# Patient Record
Sex: Male | Born: 1985 | Race: White | Hispanic: No | Marital: Single | Smoking: Current every day smoker
Health system: Southern US, Community
[De-identification: ages and names within clinical notes are randomized; demographics above are authoritative.]

## PROBLEM LIST (undated history)

## (undated) DIAGNOSIS — R41840 Attention and concentration deficit: Secondary | ICD-10-CM

---
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
## 2007-06-18 IMAGING — CT CT HEAD W/O CM
1 of 3 series · 13 of 30 positions shown, 17 images · IV contrast (agent unspecified)
Comparison: None.

CLINICAL DATA: Altered mental status.  19-year-old male. 
 HEAD CT WITHOUT CONTRAST:
TECHNIQUE: Contiguous axial images were obtained from the base of the skull through the vertex according to standard protocol without contrast.

[Series 4: recon 3: brain · axial · 0.47mm/px · z∈[+208,+352]mm · 13 of 64 slices shown, 17 images]
[im 5/64  brain]
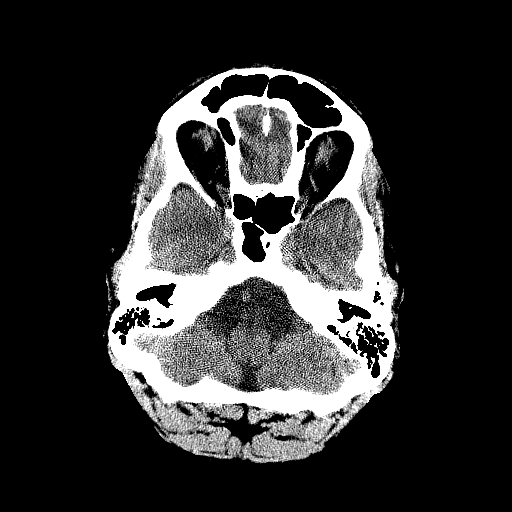
[im 5/64  bone]
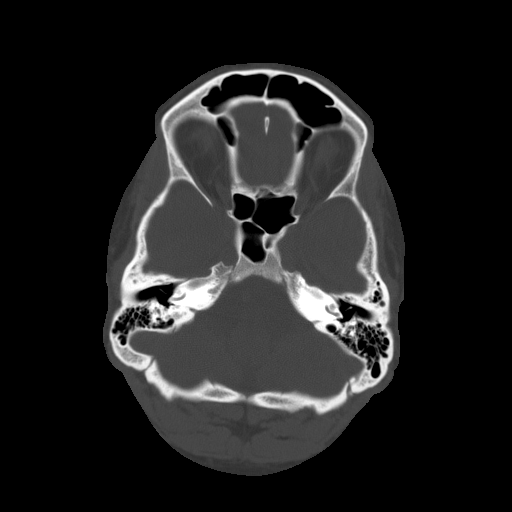
[im 10/64  brain]
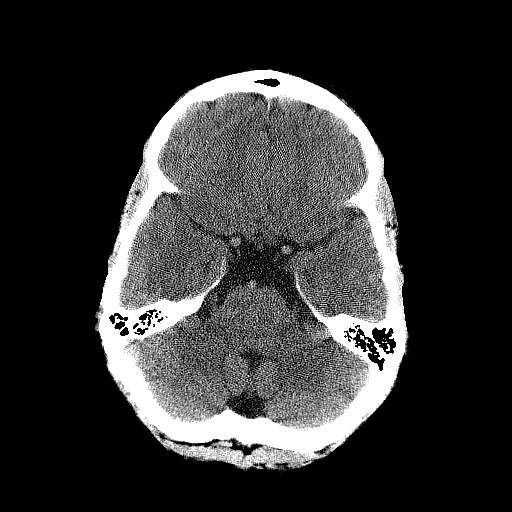
[im 14/64  brain]
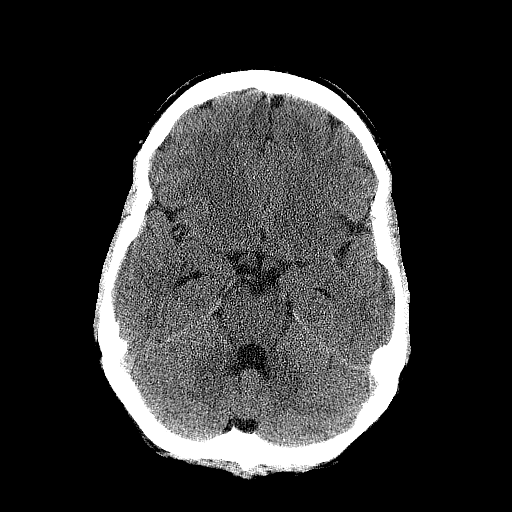
[im 19/64  brain]
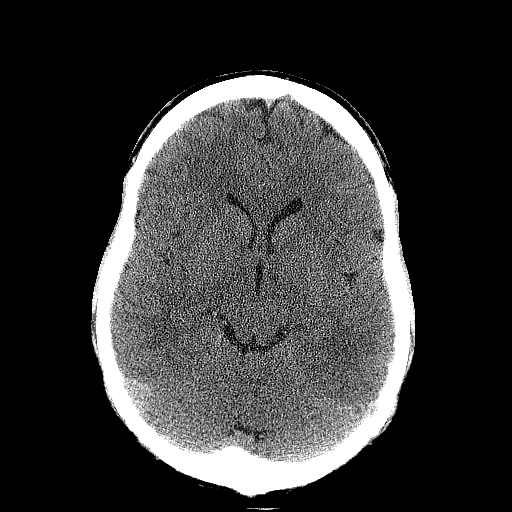
[im 23/64  brain]
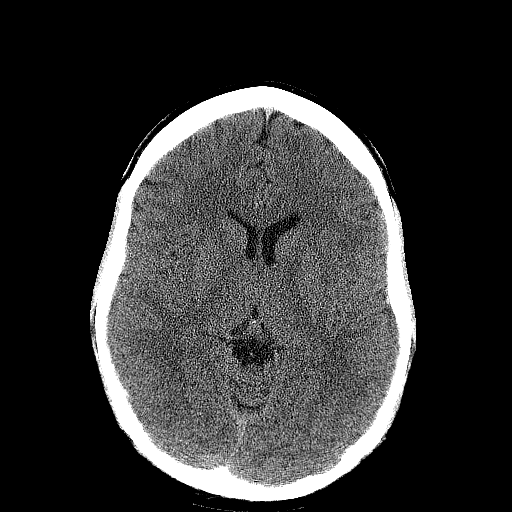
[im 23/64  bone]
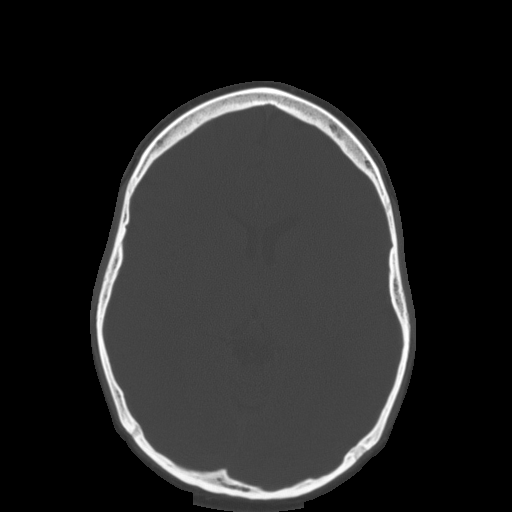
[im 28/64  brain]
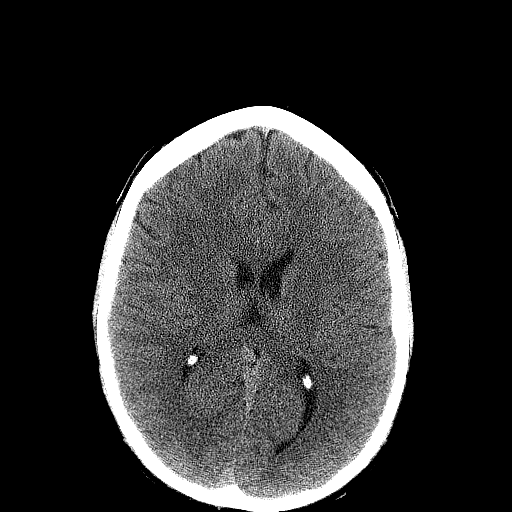
[im 32/64  brain]
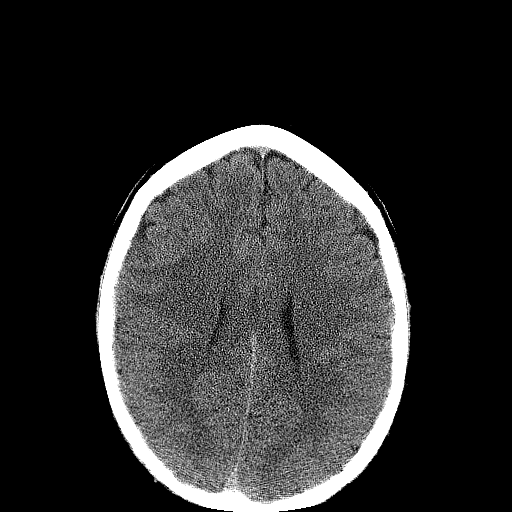
[im 37/64  brain]
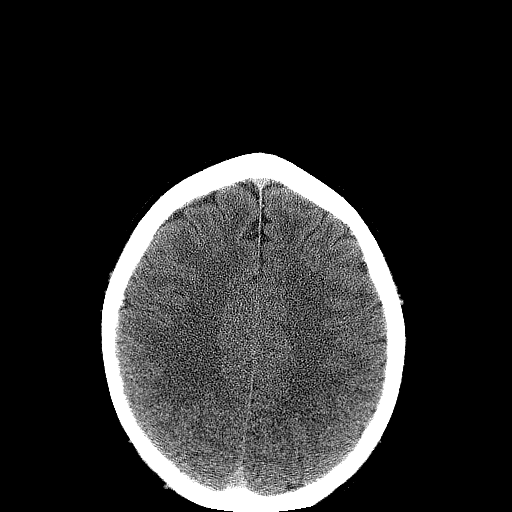
[im 41/64  brain]
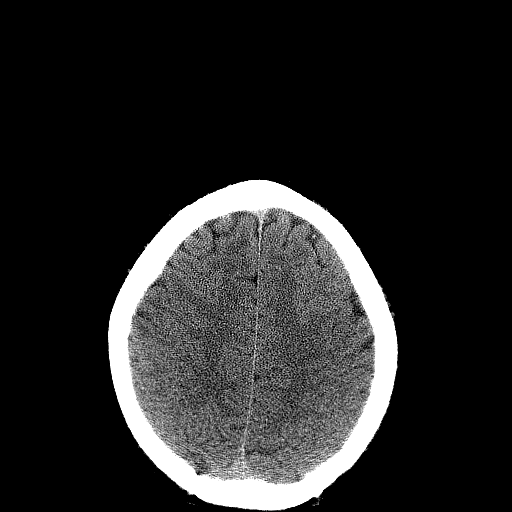
[im 41/64  bone]
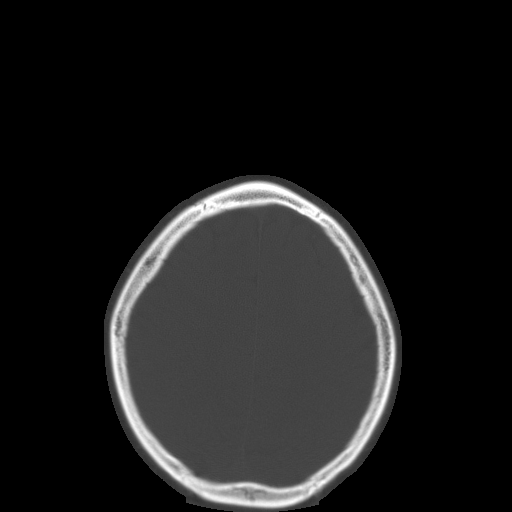
[im 46/64  brain]
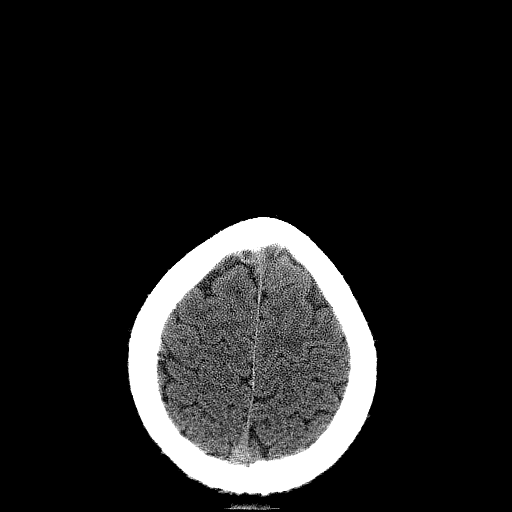
[im 50/64  brain]
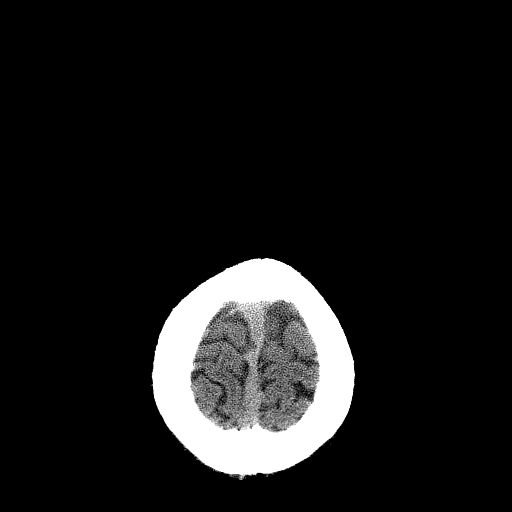
[im 55/64  brain]
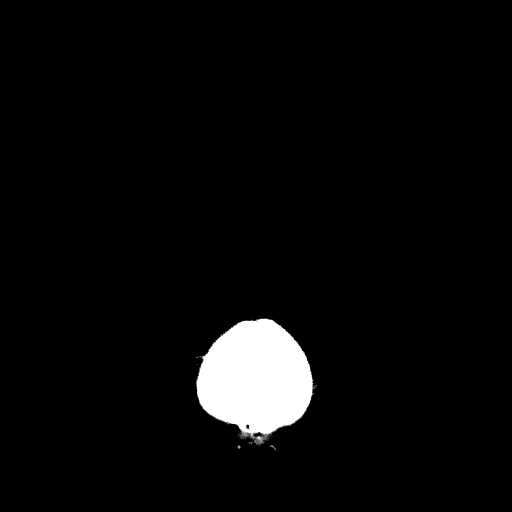
[im 59/64  brain]
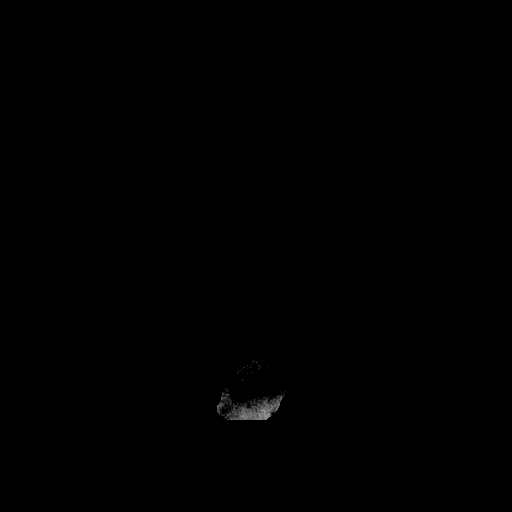
[im 59/64  bone]
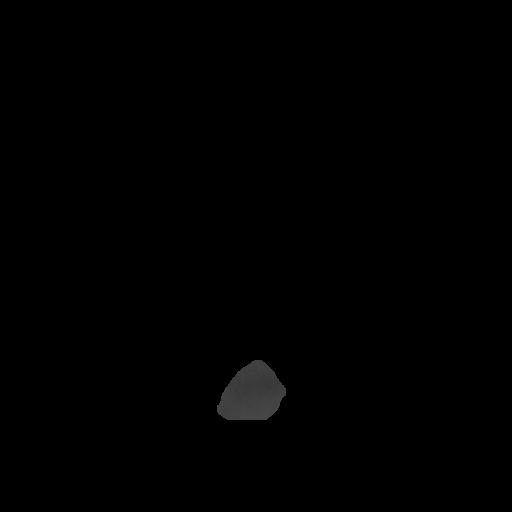

[13 of 30 positions shown; findings below may reference images not displayed]

FINDINGS: No evidence of acute intracranial abnormality including mass or mass effect, hydrocephalus, extraaxial fluid collection, midline shift, hemorrhage, or infarct.  Acute infarct may be occult on CT.  The visualized bony calvarium is unremarkable.
IMPRESSION: No evidence of intracranial abnormality. 

 ,

## 2010-11-16 ENCOUNTER — Emergency Department (INDEPENDENT_AMBULATORY_CARE_PROVIDER_SITE_OTHER): Payer: Worker's Compensation

## 2010-11-16 ENCOUNTER — Emergency Department (HOSPITAL_BASED_OUTPATIENT_CLINIC_OR_DEPARTMENT_OTHER)
Admission: EM | Admit: 2010-11-16 | Discharge: 2010-11-16 | Disposition: A | Discharge status: Home / Self Care | Payer: Worker's Compensation | Attending: Emergency Medicine | Admitting: Emergency Medicine

## 2010-11-16 ENCOUNTER — Encounter: Payer: Self-pay | Admitting: *Deleted

## 2010-11-16 DIAGNOSIS — R109 Unspecified abdominal pain: Secondary | ICD-10-CM

## 2010-11-16 DIAGNOSIS — IMO0002 Reserved for concepts with insufficient information to code with codable children: Secondary | ICD-10-CM | POA: Insufficient documentation

## 2010-11-16 DIAGNOSIS — S0990XA Unspecified injury of head, initial encounter: Secondary | ICD-10-CM

## 2010-11-16 DIAGNOSIS — M25519 Pain in unspecified shoulder: Secondary | ICD-10-CM | POA: Insufficient documentation

## 2010-11-16 DIAGNOSIS — Y92410 Unspecified street and highway as the place of occurrence of the external cause: Secondary | ICD-10-CM | POA: Insufficient documentation

## 2010-11-16 DIAGNOSIS — R51 Headache: Secondary | ICD-10-CM

## 2010-11-16 DIAGNOSIS — Z043 Encounter for examination and observation following other accident: Secondary | ICD-10-CM

## 2010-11-16 DIAGNOSIS — S301XXA Contusion of abdominal wall, initial encounter: Secondary | ICD-10-CM

## 2010-11-16 HISTORY — DX: Attention and concentration deficit: R41.840

## 2010-11-16 MED ORDER — HYDROCODONE-ACETAMINOPHEN 5-500 MG PO TABS: 1.0000 | ORAL_TABLET | Freq: Four times a day (QID) | ORAL | Status: AC | PRN

## 2010-11-16 MED ORDER — IOHEXOL 300 MG/ML  SOLN
100.0000 mL | Freq: Once | INTRAMUSCULAR | Status: AC | PRN
  Administered 2010-11-16: 100 mL via INTRAVENOUS

## 2010-11-16 NOTE — ED Provider Notes (Signed)
Medical screening examination/treatment/procedure(s) were performed by non-physician practitioner and as supervising physician I was immediately available for consultation/collaboration.   Charles B. Bernette Mayers, MD 11/16/10 1441
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
## 2013-05-15 ENCOUNTER — Emergency Department (HOSPITAL_COMMUNITY)
Admission: EM | Admit: 2013-05-15 | Discharge: 2013-05-15 | Disposition: A | Discharge status: Home / Self Care | Payer: Self-pay | Attending: Emergency Medicine | Admitting: Emergency Medicine

## 2013-05-15 ENCOUNTER — Encounter (HOSPITAL_COMMUNITY): Payer: Self-pay | Admitting: Emergency Medicine

## 2013-05-15 DIAGNOSIS — F17200 Nicotine dependence, unspecified, uncomplicated: Secondary | ICD-10-CM | POA: Insufficient documentation

## 2013-05-15 DIAGNOSIS — L0501 Pilonidal cyst with abscess: Secondary | ICD-10-CM | POA: Insufficient documentation

## 2013-05-15 DIAGNOSIS — Z8659 Personal history of other mental and behavioral disorders: Secondary | ICD-10-CM | POA: Insufficient documentation

## 2013-05-15 DIAGNOSIS — R269 Unspecified abnormalities of gait and mobility: Secondary | ICD-10-CM | POA: Insufficient documentation

## 2013-05-15 MED ORDER — ONDANSETRON HCL 4 MG PO TABS
4.0000 mg | ORAL_TABLET | Freq: Once | ORAL | Status: AC
  Administered 2013-05-15: 4 mg via ORAL
  Filled 2013-05-15: qty 1

## 2013-05-15 MED ORDER — OXYCODONE-ACETAMINOPHEN 5-325 MG PO TABS
2.0000 | ORAL_TABLET | Freq: Once | ORAL | Status: AC
  Administered 2013-05-15: 2 via ORAL
  Filled 2013-05-15: qty 2

## 2013-05-15 MED ORDER — ONDANSETRON HCL 4 MG PO TABS: 4.0000 mg | ORAL_TABLET | Freq: Three times a day (TID) | ORAL | Status: AC | PRN

## 2013-05-15 MED ORDER — OXYCODONE-ACETAMINOPHEN 5-325 MG PO TABS: 1.0000 | ORAL_TABLET | ORAL | Status: AC | PRN

## 2013-05-15 NOTE — ED Notes (Addendum)
Pt a+ox4, presents with c/o "swollen tailbone" x1 week, worsening.  Pt denies injury.  Approx 1.5" diameter pink, raised area to coccyx noted. No drng.  Pt reports 8/10 pain to area.  Pt denies fevers/chills, denies hx of similar. Otherwise skin PWD.  Speaking full/clear sentences.  MAEI.   Pt reports taking advil with relief of pain.

## 2013-05-15 NOTE — ED Provider Notes (Addendum)
CSN: [id number]     Arrival date & time 05/15/13  1050 History   First MD Initiated Contact with Patient 05/15/13 1122     Chief Complaint  Patient presents with  . Abscess  . Pain   (Consider location/radiation/quality/duration/timing/severity/associated sxs/prior Treatment) Patient is a 28 y.o. male presenting with abscess. The history is provided by the patient. No language interpreter was used.  Abscess Location:  Ano-genital Ano-genital abscess location:  Gluteal cleft Abscess quality: fluctuance, painful and redness   Red streaking: no   Duration:  7 days Progression:  Worsening Pain details:    Quality:  Burning and aching   Severity:  Severe   Progression:  Worsening Context: not diabetes   Relieved by:  Nothing Exacerbated by: sitting. Ineffective treatments:  None tried Associated symptoms: no anorexia, no fatigue, no fever, no headaches and no nausea   Risk factors: no hx of MRSA and no prior abscess     Past Medical History  Diagnosis Date  . Attention deficit    History reviewed. No pertinent past surgical history. No family history on file. History  Substance Use Topics  . Smoking status: Current Every Day Smoker  . Smokeless tobacco: Not on file  . Alcohol Use: No    Review of Systems  Constitutional: Negative for fever and fatigue.  Gastrointestinal: Negative for nausea and anorexia.  Musculoskeletal: Positive for gait problem.  Neurological: Negative for headaches.    Allergies  Review of patient's allergies indicates no known allergies.  Home Medications   Current Outpatient Rx  Name  Route  Sig  Dispense  Refill  . ibuprofen (ADVIL,MOTRIN) 200 MG tablet   Oral   Take 600 mg by mouth every 6 (six) hours as needed for moderate pain.          BP 131/95  Pulse 89  Temp(Src) 97.9 F (36.6 C) (Oral)  Resp 16  Ht 5\' 10"  (1.778 m)  Wt 240 lb (108.863 kg)  BMI 34.44 kg/m2  SpO2 99% Physical Exam  Nursing note and vitals  reviewed. Constitutional: He appears well-developed and well-nourished. No distress.  HENT:  Head: Normocephalic and atraumatic.  Eyes: Conjunctivae are normal. No scleral icterus.  Neck: Normal range of motion. Neck supple.  Cardiovascular: Normal rate, regular rhythm and normal heart sounds.   Pulmonary/Chest: Effort normal and breath sounds normal. No respiratory distress.  Abdominal: Soft. There is no tenderness.  Musculoskeletal: He exhibits no edema.       Back:  Neurological: He is alert.  Skin: Skin is warm and dry. He is not diaphoretic.  Psychiatric: His behavior is normal.    ED Course  Procedures (including critical care time) Labs Review Labs Reviewed - No data to display Imaging Review No results found.  EKG Interpretation   None      INCISION AND DRAINAGE Performed by: Arthor Captain Consent: Verbal consent obtained. Risks and benefits: risks, benefits and alternatives were discussed Type: abscess  Body area: gluteal cleft  Anesthesia: local infiltration  Incision was made with a scalpel.  Local anesthetic: lidocaine 2% w epinephrine  Anesthetic total: 5 ml  Complexity: complex Blunt dissection to break up loculations  Drainage: purulent  Drainage amount: copious  Packing material: 1/4 in iodoform gauze  Patient tolerance: Patient tolerated the procedure well with no immediate complications.    MDM   1. Pilonidal cyst with abscess    Patient with pilonidal abscess. I&D performed. Flushed and Packed. No signs fo cellulitis. Patient is advised  to return to the ED for packing removal and wound check in 48-72 hours. Supportive care and wound care given.    Arthor Captainbigail Kaliope Quinonez, PA-C 05/15/13 2122  Arthor CaptainAbigail Aylan Bayona, PA-C 05/24/13 2034

## 2013-05-15 NOTE — Discharge Instructions (Signed)
Followup with your doctor or an urgent care or the ed in order to remove your packing in 48-72 hours. You may return to the emergency department if you have  a fever that persists greater than 101 or your abscess appears to become infected (growing surrounding redness and warmth). Do not operate any heavy machinery while on pain medications. Do not consume alcohol on these medications either.  Abscess An abscess (boil or furuncle) is an infected area that contains a collection of pus.  SYMPTOMS Signs and symptoms of an abscess include pain, tenderness, redness, or hardness. You may feel a moveable soft area under your skin. An abscess can occur anywhere in the body.  TREATMENT  A surgical cut (incision) may be made over your abscess to drain the pus. Gauze may be packed into the space or a drain may be looped through the abscess cavity (pocket). This provides a drain that will allow the cavity to heal from the inside outwards. The abscess may be painful for a few days, but should feel much better if it was drained.  Your abscess, if seen early, may not have localized and may not have been drained. If not, another appointment may be required if it does not get better on its own or with medications. HOME CARE INSTRUCTIONS   Only take over-the-counter or prescription medicines for pain, discomfort, or fever as directed by your caregiver.   Take your antibiotics as directed if they were prescribed. Finish them even if you start to feel better.   Keep the skin and clothes clean around your abscess.   If the abscess was drained, you will need to use gauze dressing to collect any draining pus. Dressings will typically need to be changed 3 or more times a day.   The infection may spread by skin contact with others. Avoid skin contact as much as possible.   Practice good hygiene. This includes regular hand washing, cover any draining skin lesions, and do not share personal care items.   If you  participate in sports, do not share athletic equipment, towels, whirlpools, or personal care items. Shower after every practice or tournament.   If a draining area cannot be adequately covered:   Do not participate in sports.   Children should not participate in day care until the wound has healed or drainage stops.   If your caregiver has given you a follow-up appointment, it is very important to keep that appointment. Not keeping the appointment could result in a much worse infection, chronic or permanent injury, pain, and disability. If there is any problem keeping the appointment, you must call back to this facility for assistance.  SEEK MEDICAL CARE IF:   You develop increased pain, swelling, redness, drainage, or bleeding in the wound site.   You develop signs of generalized infection including muscle aches, chills, fever, or a general ill feeling.   You have an oral temperature above 102 F (38.9 C).  MAKE SURE YOU:   Understand these instructions.   Will watch your condition.   Will get help right away if you are not doing well or get worse.  Document Released: 01/08/2005 Document Revised: 12/11/2010 Document Reviewed: 11/02/2007 [facility] Patient Information 2012 [place].    Pilonidal Cyst A pilonidal cyst occurs when hairs get trapped (ingrown) beneath the skin in the crease between the buttocks over your sacrum (the bone under that crease). Pilonidal cysts are most common in young men with a lot of body hair. When  the cyst is ruptured (breaks) or leaking, fluid from the cyst may cause burning and itching. If the cyst becomes infected, it causes a painful swelling filled with pus (abscess). The pus and trapped hairs need to be removed (often by lancing) so that the infection can heal. However, recurrence is common and an operation may be needed to remove the cyst. HOME CARE INSTRUCTIONS   If the cyst was NOT INFECTED:  Keep the area clean and dry. Bathe or shower  daily. Wash the area well with a germ-killing soap. Warm tub baths may help prevent infection and help with drainage. Dry the area well with a towel.  Avoid tight clothing to keep area as moisture free as possible.  Keep area between buttocks as free of hair as possible. A depilatory may be used.  If the cyst WAS INFECTED and needed to be drained:  Your caregiver packed the wound with gauze to keep the wound open. This allows the wound to heal from the inside outwards and continue draining.  Return for a wound check in 1 day or as suggested.  If you take tub baths or showers, repack the wound with gauze following them. Sponge baths (at the sink) are a good alternative.  If an antibiotic was ordered to fight the infection, take as directed.  Only take over-the-counter or prescription medicines for pain, discomfort, or fever as directed by your caregiver.  After the drain is removed, use sitz baths for 20 minutes 4 times per day. Clean the wound gently with mild unscented soap, pat dry, and then apply a dry dressing. SEEK MEDICAL CARE IF:   You have increased pain, swelling, redness, drainage, or bleeding from the area.  You have a fever.  You have muscles aches, dizziness, or a general ill feeling. Document Released: 03/28/2000 Document Revised: 06/23/2011 Document Reviewed: 05/26/2008 [facility] Patient Information 2014 [place], [place].  Abscess An abscess is an infected area that contains a collection of pus and debris.It can occur in almost any part of the body. An abscess is also known as a furuncle or boil. CAUSES  An abscess occurs when tissue gets infected. This can occur from blockage of oil or sweat glands, infection of hair follicles, or a minor injury to the skin. As the body tries to fight the infection, pus collects in the area and creates pressure under the skin. This pressure causes pain. People with weakened immune systems have difficulty fighting infections and get  certain abscesses more often.  SYMPTOMS Usually an abscess develops on the skin and becomes a painful mass that is red, warm, and tender. If the abscess forms under the skin, you may feel a moveable soft area under the skin. Some abscesses break open (rupture) on their own, but most will continue to get worse without care. The infection can spread deeper into the body and eventually into the bloodstream, causing you to feel ill.  DIAGNOSIS  Your caregiver will take your medical history and perform a physical exam. A sample of fluid may also be taken from the abscess to determine what is causing your infection. TREATMENT  Your caregiver may prescribe antibiotic medicines to fight the infection. However, taking antibiotics alone usually does not cure an abscess. Your caregiver may need to make a small cut (incision) in the abscess to drain the pus. In some cases, gauze is packed into the abscess to reduce pain and to continue draining the area. HOME CARE INSTRUCTIONS   Only take over-the-counter or prescription medicines  for pain, discomfort, or fever as directed by your caregiver.  If you were prescribed antibiotics, take them as directed. Finish them even if you start to feel better.  If gauze is used, follow your caregiver's directions for changing the gauze.  To avoid spreading the infection:  Keep your draining abscess covered with a bandage.  Wash your hands well.  Do not share personal care items, towels, or whirlpools with others.  Avoid skin contact with others.  Keep your skin and clothes clean around the abscess.  Keep all follow-up appointments as directed by your caregiver. SEEK MEDICAL CARE IF:   You have increased pain, swelling, redness, fluid drainage, or bleeding.  You have muscle aches, chills, or a general ill feeling.  You have a fever. MAKE SURE YOU:   Understand these instructions.  Will watch your condition.  Will get help right away if you are not doing  well or get worse. Document Released: 01/08/2005 Document Revised: 09/30/2011 Document Reviewed: 06/13/2011 [facility] Patient Information 2014 [place], [place].

## 2013-05-16 NOTE — ED Provider Notes (Signed)
Medical screening examination/treatment/procedure(s) were performed by non-physician practitioner and as supervising physician I was immediately available for consultation/collaboration.  EKG Interpretation   None         Erionna Strum H Rmani Kapusta, MD 05/16/13 1554 

## 2013-05-26 NOTE — ED Provider Notes (Signed)
Medical screening examination/treatment/procedure(s) were performed by non-physician practitioner and as supervising physician I was immediately available for consultation/collaboration.  EKG Interpretation   None         Richardean Canalavid H Yao, MD 05/26/13 2205

## 2014-08-11 ENCOUNTER — Encounter (HOSPITAL_COMMUNITY): Payer: Self-pay

## 2014-08-11 ENCOUNTER — Emergency Department (HOSPITAL_COMMUNITY)
Admission: EM | Admit: 2014-08-11 | Discharge: 2014-08-11 | Disposition: A | Discharge status: Home / Self Care | Payer: Self-pay | Attending: Emergency Medicine | Admitting: Emergency Medicine

## 2014-08-11 DIAGNOSIS — R238 Other skin changes: Secondary | ICD-10-CM

## 2014-08-11 DIAGNOSIS — R000 Tachycardia, unspecified: Secondary | ICD-10-CM | POA: Insufficient documentation

## 2014-08-11 DIAGNOSIS — L709 Acne, unspecified: Secondary | ICD-10-CM | POA: Insufficient documentation

## 2014-08-11 DIAGNOSIS — Z720 Tobacco use: Secondary | ICD-10-CM | POA: Insufficient documentation

## 2014-08-11 MED ORDER — SULFAMETHOXAZOLE-TRIMETHOPRIM 800-160 MG PO TABS: 1.0000 | ORAL_TABLET | Freq: Two times a day (BID) | ORAL | Status: AC

## 2014-08-11 MED ORDER — SULFAMETHOXAZOLE-TRIMETHOPRIM 800-160 MG PO TABS
1.0000 | ORAL_TABLET | Freq: Once | ORAL | Status: AC
Start: 2014-08-11 — End: 2014-08-11
  Administered 2014-08-11: 1 via ORAL
  Filled 2014-08-11: qty 1

## 2014-08-11 MED ORDER — LORAZEPAM 1 MG PO TABS
1.0000 mg | ORAL_TABLET | Freq: Once | ORAL | Status: AC
  Administered 2014-08-11: 1 mg via ORAL
  Filled 2014-08-11: qty 1

## 2014-08-11 NOTE — ED Provider Notes (Signed)
CSN: [id number]     Arrival date & time 08/11/14  0710 History   First MD Initiated Contact with Patient 08/11/14 0719     Chief Complaint  Patient presents with  . Rash     (Consider location/radiation/quality/duration/timing/severity/associated sxs/prior Treatment) HPI Stephen Vargas is a 29 y.o. male who comes in for evaluation of rash on his left groin. Patient states he was concerned it might be testicular cancer because his brother's friend died of testicular cancer he wanted to get checked out. He reports he noticed a red bump on the left side of his scrotum that was tender to touch, without drainage. He reports trying to "pop it" and when he did not workDenies fevers, chills, difficulty urinating, penile discharge, scrotal swelling, groin pain.  Past Medical History  Diagnosis Date  . Attention deficit    History reviewed. No pertinent past surgical history. Family History  Problem Relation Age of Onset  . Multiple sclerosis Mother   . Diverticulitis Father    History  Substance Use Topics  . Smoking status: Current Every Day Smoker -- 0.50 packs/day    Types: Cigarettes  . Smokeless tobacco: Never Used  . Alcohol Use: No    Review of Systems  Constitutional: Negative for fever.  Respiratory: Negative for shortness of breath.   Cardiovascular: Negative for chest pain.  Skin: Positive for rash.      Allergies  Review of patient's allergies indicates no known allergies.  Home Medications   Prior to Admission medications   Medication Sig Start Date End Date Taking? Authorizing Provider  ondansetron (ZOFRAN) 4 MG tablet Take 1 tablet (4 mg total) by mouth every 8 (eight) hours as needed for nausea or vomiting. Patient not taking: Reported on 08/11/2014 05/15/13   Arthor CaptainAbigail Harris, PA-C  oxyCODONE-acetaminophen (PERCOCET) 5-325 MG per tablet Take 1-2 tablets by mouth every 4 (four) hours as needed. Patient not taking: Reported on 08/11/2014 05/15/13   Arthor CaptainAbigail Harris,  PA-C   BP 140/79 mmHg  Pulse 109  Temp(Src) 98.4 F (36.9 C) (Oral)  Resp 19  SpO2 99% Physical Exam  Constitutional:  Awake, alert, nontoxic appearance. Patient is very anxious  HENT:  Head: Atraumatic.  Eyes: Right eye exhibits no discharge. Left eye exhibits no discharge.  Neck: Neck supple.  Cardiovascular: Regular rhythm and normal heart sounds.  Exam reveals no gallop and no friction rub.   No murmur heard. tachycardic  Pulmonary/Chest: Effort normal. He exhibits no tenderness.  Abdominal: Soft. There is no tenderness. There is no rebound.  Genitourinary:  Small area of erythema, consistent with furuncle or pimple on left side of scrotum. Shaved pubic area with folliculitis  Musculoskeletal: He exhibits no tenderness.  Baseline ROM, no obvious new focal weakness.  Neurological:  Mental status and motor strength appears baseline for patient and situation.  Skin: No rash noted.  Psychiatric: He has a normal mood and affect.  Nursing note and vitals reviewed.   ED Course  Procedures (including critical care time) Labs Review Labs Reviewed - No data to display  Imaging Review No results found.   EKG Interpretation None     Meds given in ED:  Medications  sulfamethoxazole-trimethoprim (BACTRIM DS,SEPTRA DS) 800-160 MG per tablet 1 tablet (1 tablet Oral Given 08/11/14 0755)  LORazepam (ATIVAN) tablet 1 mg (1 mg Oral Given 08/11/14 0800)    New Prescriptions   No medications on file   Filed Vitals:   08/11/14 0717 08/11/14 0923  BP: 157/107 140/79  Pulse:  132 109  Temp: 98.1 F (36.7 C) 98.4 F (36.9 C)  TempSrc: Oral Oral  Resp: 19   SpO2: 100% 99%     Meds given in ED:  Medications  sulfamethoxazole-trimethoprim (BACTRIM DS,SEPTRA DS) 800-160 MG per tablet 1 tablet (1 tablet Oral Given 08/11/14 0755)  LORazepam (ATIVAN) tablet 1 mg (1 mg Oral Given 08/11/14 0800)    New Prescriptions   No medications on file   Filed Vitals:   08/11/14 0717  08/11/14 0923  BP: 157/107 140/79  Pulse: 132 109  Temp: 98.1 F (36.7 C) 98.4 F (36.9 C)  TempSrc: Oral Oral  Resp: 19   SpO2: 100% 99%    MDM  Patient is tachycardic, however vital signs are improving in ED. Suspect due to anxiety. Physical exam consistent with pubic folliculitis DC with Septra. Encouraged to continue symptomatic care at home with hot compresses. Discussed follow-up with primary care for further evaluation and management of symptoms. Not concerning for other acute or emergent pathology at this time. Final diagnoses:  Pimples        Joycie Peek, PA-C 08/11/14 0945  Donnetta Hutching, MD 08/11/14 1540

## 2014-08-11 NOTE — Discharge Instructions (Signed)
Folliculitis  Folliculitis is redness, soreness, and swelling (inflammation) of the hair follicles. This condition can occur anywhere on the body. People with weakened immune systems, diabetes, or obesity have a greater risk of getting folliculitis. CAUSES  Bacterial infection. This is the most common cause.  Fungal infection.  Viral infection.  Contact with certain chemicals, especially oils and tars. Long-term folliculitis can result from bacteria that live in the nostrils. The bacteria may trigger multiple outbreaks of folliculitis over time. SYMPTOMS Folliculitis most commonly occurs on the scalp, thighs, legs, back, buttocks, and areas where hair is shaved frequently. An early sign of folliculitis is a small, white or yellow, pus-filled, itchy lesion (pustule). These lesions appear on a red, inflamed follicle. They are usually less than 0.2 inches (5 mm) wide. When there is an infection of the follicle that goes deeper, it becomes a boil or furuncle. A group of closely packed boils creates a larger lesion (carbuncle). Carbuncles tend to occur in hairy, sweaty areas of the body. DIAGNOSIS  Your caregiver can usually tell what is wrong by doing a physical exam. A sample may be taken from one of the lesions and tested in a lab. This can help determine what is causing your folliculitis. TREATMENT  Treatment may include:  Applying warm compresses to the affected areas.  Taking antibiotic medicines orally or applying them to the skin.  Draining the lesions if they contain a large amount of pus or fluid.  Laser hair removal for cases of long-lasting folliculitis. This helps to prevent regrowth of the hair. HOME CARE INSTRUCTIONS  Apply warm compresses to the affected areas as directed by your caregiver.  If antibiotics are prescribed, take them as directed. Finish them even if you start to feel better.  You may take over-the-counter medicines to relieve itching.  Do not shave  irritated skin.  Follow up with your caregiver as directed. SEEK IMMEDIATE MEDICAL CARE IF:   You have increasing redness, swelling, or pain in the affected area.  You have a fever. MAKE SURE YOU:  Understand these instructions.  Will watch your condition.  Will get help right away if you are not doing well or get worse. Document Released: 06/09/2001 Document Revised: 09/30/2011 Document Reviewed: 07/01/2011 [facility] Patient Information 2015 [place], [place]. This information is not intended to replace advice given to you by your health care provider. Make sure you discuss any questions you have with your health care provider.  Please follow-up with your primary care for further evaluation and management of your symptoms. You may also use hot compresses to help with your symptoms. Return to ED for worsening symptoms.

## 2014-08-11 NOTE — ED Notes (Signed)
Patient states that he has a red rash on the pubic area x 1 year. He sates that he now has a slight red rash on the left groin area. Patient stated that he tried to "pop it." Patient states he woke yesterday with pressure in his lower abdomen. Patient is very anxious.

## 2019-10-01 ENCOUNTER — Emergency Department (HOSPITAL_BASED_OUTPATIENT_CLINIC_OR_DEPARTMENT_OTHER)
Admission: EM | Admit: 2019-10-01 | Discharge: 2019-10-01 | Disposition: A | Discharge status: Home / Self Care | Payer: BC Managed Care – PPO | Attending: Emergency Medicine | Admitting: Emergency Medicine

## 2019-10-01 ENCOUNTER — Other Ambulatory Visit: Payer: Self-pay

## 2019-10-01 ENCOUNTER — Encounter (HOSPITAL_BASED_OUTPATIENT_CLINIC_OR_DEPARTMENT_OTHER): Payer: Self-pay | Admitting: Emergency Medicine

## 2019-10-01 DIAGNOSIS — S0191XA Laceration without foreign body of unspecified part of head, initial encounter: Secondary | ICD-10-CM | POA: Insufficient documentation

## 2019-10-01 DIAGNOSIS — W311XXA Contact with metalworking machines, initial encounter: Secondary | ICD-10-CM | POA: Insufficient documentation

## 2019-10-01 DIAGNOSIS — S0990XA Unspecified injury of head, initial encounter: Secondary | ICD-10-CM | POA: Diagnosis present

## 2019-10-01 DIAGNOSIS — Y998 Other external cause status: Secondary | ICD-10-CM | POA: Insufficient documentation

## 2019-10-01 DIAGNOSIS — Y9289 Other specified places as the place of occurrence of the external cause: Secondary | ICD-10-CM | POA: Diagnosis not present

## 2019-10-01 DIAGNOSIS — Y9389 Activity, other specified: Secondary | ICD-10-CM | POA: Diagnosis not present

## 2019-10-01 MED ORDER — LIDOCAINE-EPINEPHRINE 2 %-1:100000 IJ SOLN
20.0000 mL | Freq: Once | INTRAMUSCULAR | Status: DC
Start: 2019-10-01 — End: 2019-10-01

## 2019-10-01 MED ORDER — LIDOCAINE-EPINEPHRINE (PF) 1 %-1:200000 IJ SOLN
INTRAMUSCULAR | Status: AC
  Administered 2019-10-01: 30 mL
  Filled 2019-10-01: qty 30

## 2019-10-01 MED ORDER — LIDOCAINE-EPINEPHRINE (PF) 2 %-1:200000 IJ SOLN: 20.0000 mL | Freq: Once | INTRAMUSCULAR | Status: DC

## 2019-10-01 MED ORDER — TETANUS-DIPHTH-ACELL PERTUSSIS 5-2.5-18.5 LF-MCG/0.5 IM SUSP
0.5000 mL | Freq: Once | INTRAMUSCULAR | Status: AC
  Administered 2019-10-01: 0.5 mL via INTRAMUSCULAR
  Filled 2019-10-01: qty 0.5

## 2019-10-01 NOTE — Discharge Instructions (Signed)
Please follow up with your primary care provider within 5-7 days for re-evaluation of your symptoms. If you do not have a primary care provider, information for a healthcare clinic has been provided for you to make arrangements for follow up care.  Please return to the emergency room immediately if you experience any new or worsening symptoms or any symptoms that indicate worsening infection such as fevers, increased redness/swelling/pain, warmth, or drainage from the affected area.    

## 2019-10-01 NOTE — ED Provider Notes (Signed)
[facility] EMERGENCY DEPARTMENT Provider Note   CSN: [id number] Arrival date & time: 10/01/19  1309     History Chief Complaint  Patient presents with  . Head Injury    Lonza Shimabukuro is a 34 y.o. male.  HPI   Patient is a 34 year old male who presents to the emergency department today for evaluation of a head injury.  Patient states that what he told the triage nurse actually was not true.  He was at work prior to arrival and was working with a machine when a bolt flew out and hit him on the left side of the head.  He denies LOC.  States that he really did not even feel himself get hit he just noticed blood to the left side of his forehead.  He denies any headache, dizziness, lightheadedness, visual changes, unilateral numbness or weakness.  He has minimal pain and states he only came to the emergency department due to continued bleeding.  He is not sure when his last Tdap was.  Past Medical History:  Diagnosis Date  . Attention deficit     There are no problems to display for this patient.   History reviewed. No pertinent surgical history.     Family History  Problem Relation Age of Onset  . Multiple sclerosis Mother   . Diverticulitis Father     Social History   Tobacco Use  . Smoking status: Current Every Day Smoker    Packs/day: 0.50    Types: Cigarettes  . Smokeless tobacco: Never Used  Substance Use Topics  . Alcohol use: No  . 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 Authorizing Provider  ondansetron (ZOFRAN) 4 MG tablet Take 1 tablet (4 mg total) by mouth every 8 (eight) hours as needed for nausea or vomiting. Patient not taking: Reported on 08/11/2014 05/15/13   Margarita Mail, PA-C  oxyCODONE-acetaminophen (PERCOCET) 5-325 MG per tablet Take 1-2 tablets by mouth every 4 (four) hours as needed. Patient not taking: Reported on  08/11/2014 05/15/13   Margarita Mail, PA-C    Allergies    Patient has no known allergies.  Review of Systems   Review of Systems  Eyes: Negative for visual disturbance.  Gastrointestinal: Negative for nausea and vomiting.  Skin: Positive for wound.  Neurological: Negative for dizziness, weakness, light-headedness, numbness and headaches.       Minor head injury    Physical Exam Updated Vital Signs BP 134/88 (BP Location: Right Arm)   Pulse 70   Temp 98.5 F (36.9 C) (Oral)   Resp 18   Ht 5\' 10"  (1.778 m)   Wt 104.3 kg   SpO2 98%   BMI 33.00 kg/m   Physical Exam Constitutional:      General: He is not in acute distress.    Appearance: He is well-developed.  HENT:     Head:     Comments: 2 cm laceration to the left lateral forehead Eyes:     Conjunctiva/sclera: Conjunctivae normal.  Cardiovascular:     Rate and Rhythm: Normal rate.  Pulmonary:     Effort: Pulmonary effort is normal.  Skin:    General: Skin is warm and dry.  Neurological:     Mental Status: He is alert and oriented to person, place, and time.     Comments: Mental Status:  Alert, thought content appropriate,  able to give a coherent history. Speech fluent without evidence of aphasia. Able to follow 2 step commands without difficulty.  Cranial Nerves:  II: pupils equal, round, reactive to light III,IV, VI: ptosis not present, extra-ocular motions intact bilaterally  V,VII: smile symmetric, facial light touch sensation equal VIII: hearing grossly normal to voice  X: uvula elevates symmetrically  XI: bilateral shoulder shrug symmetric and strong XII: midline tongue extension without fassiculations Motor:  Normal tone. 5/5 strength of BUE and BLE major muscle groups including strong and equal grip strength and dorsiflexion/plantar flexion Sensory: light touch normal in all extremities. Cerebellar: normal finger-to-nose with bilateral upper extremities Gait: normal gait and balance.      ED Results  / Procedures / Treatments   Labs (all labs ordered are listed, but only abnormal results are displayed) Labs Reviewed - No data to display  EKG None  Radiology No results found.  Procedures .Marland KitchenLaceration Repair  Date/Time: 10/01/2019 3:44 PM Performed by: Karrie Meres, PA-C Authorized by: Karrie Meres, PA-C   Consent:    Consent obtained:  Verbal   Consent given by:  Patient   Risks discussed:  Infection, pain and poor cosmetic result   Alternatives discussed:  No treatment Anesthesia (see MAR for exact dosages):    Anesthesia method:  Local infiltration   Local anesthetic:  Lidocaine 1% w/o epi Laceration details:    Location: left forehead.   Length (cm):  2 Repair type:    Repair type:  Simple Pre-procedure details:    Preparation:  Patient was prepped and draped in usual sterile fashion Exploration:    Hemostasis achieved with:  Direct pressure and epinephrine   Wound exploration: wound explored through full range of motion and entire depth of wound probed and visualized     Contaminated: no   Treatment:    Area cleansed with:  Saline   Amount of cleaning:  Standard   Irrigation solution:  Sterile saline   Irrigation method:  Pressure wash   Visualized foreign bodies/material removed: no   Skin repair:    Repair method:  Sutures   Suture size:  5-0   Suture material:  Fast-absorbing gut   Suture technique:  Simple interrupted   Number of sutures:  3 Approximation:    Approximation:  Close Post-procedure details:    Dressing:  Open (no dressing)   Patient tolerance of procedure:  Tolerated well, no immediate complications   (including critical care time)  Medications Ordered in ED Medications  lidocaine-EPINEPHrine (XYLOCAINE W/EPI) 2 %-1:200000 (PF) injection 20 mL (20 mLs Intradermal Not Given 10/01/19 1517)  Tdap (BOOSTRIX) injection 0.5 mL (0.5 mLs Intramuscular Given 10/01/19 1516)  lidocaine-EPINEPHrine (XYLOCAINE-EPINEPHrine) 1 %-1:200000  (PF) injection (30 mLs  Given 10/01/19 1515)    ED Course  I have reviewed the triage vital signs and the nursing notes.  Pertinent labs & imaging results that were available during my care of the patient were reviewed by me and considered in my medical decision making (see chart for details).    MDM Rules/Calculators/A&P                          Laceration to the left side of the head that occurred after being hit in the head with a bolt. Normal neuro exam. Low concern for skull fx or intracranial injury therefore head CT deferred. Pt in agreement with this decision. Pressure irrigation performed. Wound explored and base of wound visualized in  a bloodless field without evidence of foreign body.  Laceration occurred < 8 hours prior to repair which was well tolerated.  Tdap updated.  Pt has  no comorbidities to effect normal wound healing. Pt discharged  without antibiotics.  Discussed suture home care with patient and answered questions. Absorbable sutures used; they are to return to the ED for signs of infection. Pt is hemodynamically stable with no complaints prior to dc.   Final Clinical Impression(s) / ED Diagnoses Final diagnoses:  Minor head injury, initial encounter  Laceration of head without foreign body, unspecified part of head, initial encounter    Rx / DC Orders ED Discharge Orders    None       Karrie Meres, PA-C 10/01/19 1546    Alvira Monday, MD 10/01/19 2235

## 2019-10-01 NOTE — ED Triage Notes (Signed)
States he was weed eating his yard when something hit him in the head. States he has a laceration. Bandage in place

## 2021-07-08 ENCOUNTER — Other Ambulatory Visit: Payer: Self-pay

## 2021-07-08 ENCOUNTER — Emergency Department (HOSPITAL_BASED_OUTPATIENT_CLINIC_OR_DEPARTMENT_OTHER)
Admission: EM | Admit: 2021-07-08 | Discharge: 2021-07-08 | Disposition: A | Discharge status: Home / Self Care | Payer: BC Managed Care – PPO | Attending: Emergency Medicine | Admitting: Emergency Medicine

## 2021-07-08 ENCOUNTER — Encounter (HOSPITAL_BASED_OUTPATIENT_CLINIC_OR_DEPARTMENT_OTHER): Payer: Self-pay

## 2021-07-08 DIAGNOSIS — M7989 Other specified soft tissue disorders: Secondary | ICD-10-CM | POA: Diagnosis present

## 2021-07-08 DIAGNOSIS — M71379 Other bursal cyst, unspecified ankle and foot: Secondary | ICD-10-CM

## 2021-07-08 DIAGNOSIS — M85671 Other cyst of bone, right ankle and foot: Secondary | ICD-10-CM | POA: Insufficient documentation

## 2021-07-08 NOTE — Discharge Instructions (Signed)
Follow up with your Orthopaedist for evaluation.   

## 2021-07-08 NOTE — ED Triage Notes (Signed)
Pt states ganglion cyst right ankle for past 2 years, painful, trying to get into a surgeon but unable to schedule an appointment ?

## 2021-07-08 NOTE — ED Provider Notes (Signed)
?  [facility] EMERGENCY DEPARTMENT ?Provider Note ? ? ?CSN: [id number] ?Arrival date & time: 07/08/21  0915 ? ?  ? ?History ? ?Chief Complaint  ?Patient presents with  ? Cyst  ?  Rt ankle  ? ? ?Stephen Vargas is a 36 y.o. male. ? ?Pt reports he has a cyst on his right ankle.  Pt reports area is red and swollen and he can not tolerate wearing boots and is unable to work.  Pt plans to see Orthopaedist next week ? ?The history is provided by the patient. No language interpreter was used.  ? ?  ? ?Home Medications ?Prior to Admission medications   ?Medication Sig Start Date End Date Taking? Authorizing Provider  ?ondansetron (ZOFRAN) 4 MG tablet Take 1 tablet (4 mg total) by mouth every 8 (eight) hours as needed for nausea or vomiting. ?Patient not taking: Reported on 08/11/2014 05/15/13   Arthor Captain, PA-C  ?oxyCODONE-acetaminophen (PERCOCET) 5-325 MG per tablet Take 1-2 tablets by mouth every 4 (four) hours as needed. ?Patient not taking: Reported on 08/11/2014 05/15/13   Arthor Captain, PA-C  ?   ? ?Allergies    ?Patient has no known allergies.   ? ?Review of Systems   ?Review of Systems  ?All other systems reviewed and are negative. ? ?Physical Exam ?Updated Vital Signs ?BP 138/85   Pulse 76   Temp 97.9 ?F (36.6 ?C) (Oral)   Resp 16   Ht 5\' 11"  (1.803 m)   Wt 88.5 kg   SpO2 100%   BMI 27.20 kg/m?  ?Physical Exam ?Vitals reviewed.  ?Musculoskeletal:     ?   General: Swelling and tenderness present.  ?   Comments: 1.2 cm swollen cystic area right ankle,  pain to palpation  nv and ns intact   ?Skin: ?   General: Skin is warm.  ?Neurological:  ?   General: No focal deficit present.  ?Psychiatric:     ?   Mood and Affect: Mood normal.  ? ? ?ED Results / Procedures / Treatments   ?Labs ?(all labs ordered are listed, but only abnormal results are displayed) ?Labs Reviewed - No data to display ? ?EKG ?None ? ?Radiology ?No results found. ? ?Procedures ?Procedures  ? ? ?Medications Ordered in ED ?Medications  - No data to display ? ?ED Course/ Medical Decision Making/ A&P ?  ?                        ?Medical Decision Making ? ?MDM:  Pt advised to follow up with Orthopaedist for evaltuion.  ? ? ? ? ? ? ? 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 ? ?Rx / DC Orders ?ED Discharge Orders   ? ? None  ? ?  ? ?An After Visit Summary was printed and given to the patient.  ?  ? , PA-C ?07/08/21 07/10/21 ? ?  ?[id number], MD ?07/08/21 2046 ? ?

## 2021-07-08 NOTE — ED Notes (Signed)
Pt states needs  doctors note for work since his ortho appointment is not until next week ?
